# Patient Record
Sex: Female | Born: 1965 | Race: Black or African American | Hispanic: No | Smoking: Never smoker
Health system: Southern US, Community
[De-identification: ages and names within clinical notes are randomized; demographics above are authoritative.]

## PROBLEM LIST (undated history)

## (undated) DIAGNOSIS — I10 Essential (primary) hypertension: Secondary | ICD-10-CM

## (undated) DIAGNOSIS — E119 Type 2 diabetes mellitus without complications: Secondary | ICD-10-CM

## (undated) DIAGNOSIS — E785 Hyperlipidemia, unspecified: Secondary | ICD-10-CM

## (undated) HISTORY — DX: Essential (primary) hypertension: I10

## (undated) HISTORY — DX: Hyperlipidemia, unspecified: E78.5

## (undated) HISTORY — DX: Type 2 diabetes mellitus without complications: E11.9

---

## 2020-06-06 NOTE — Progress Notes (Signed)
Cardiology Office Note   Date:  06/07/2020   ID:  Abigail Ross, DOB Feb 24, 1965, MRN [id number]  PCP:  Lewis Moccasin, MD  Cardiologist:   No primary care provider on file. Referring:  Lewis Moccasin, MD  Chief Complaint  Patient presents with  . New Patient (Initial Visit)  . Chest Pain    Pressure.      History of Present Illness: Abigail Ross is a 55 y.o. female who presents for chest pain.  He was referred by Lewis Moccasin, MD.  The patient has no past cardiac history but she has multiple cardiovascular risk factors.  She has been having symptoms of palpitations.  This is when she is sometimes startles awake at night.  Her heart will race for a few minutes and then go away.  She really does not feel this during the day.  She does have some tingling and chest pressure.  This happens sporadically.  It is brief.  She cannot bring it on with activity.  She does have some leg pain.  This does not occur with activity either but it is in her left calf and is a cramping discomfort occasionally.  She cannot pinpoint why this happens.  She has multiple cardiovascular risk factors but has never had any cardiovascular testing.  She does not have associated symptoms such as shortness of breath, PND or orthopnea.  She has had no palpitations, presyncope or syncope.  He has had no weight gain or edema.     Past Medical History:  Diagnosis Date  . DM (diabetes mellitus) (HCC)   . Dyslipidemia   . HTN (hypertension)     History reviewed. No pertinent surgical history.   Current Outpatient Medications  Medication Sig Dispense Refill  . azelastine (ASTELIN) 0.1 % nasal spray Place 2 sprays into both nostrils daily as needed. Use in each nostril as directed    . bisoprolol (ZEBETA) 10 MG tablet Take 10 mg by mouth daily.    Marland Kitchen lisinopril-hydrochlorothiazide (ZESTORETIC) 20-25 MG tablet Take 1 tablet by mouth daily.    . metFORMIN (GLUCOPHAGE) 500 MG tablet Take 500 mg by mouth 2  (two) times daily with a meal.    . montelukast (SINGULAIR) 10 MG tablet Take 10 mg by mouth daily as needed.    . rosuvastatin (CRESTOR) 20 MG tablet Take 20 mg by mouth. Pt takes 1 tablet and a half daily at bedtime    . Semaglutide (RYBELSUS) 7 MG TABS Take 1 tablet by mouth daily.     No current facility-administered medications for this visit.    Allergies:   Patient has no known allergies.    Social History:  The patient  reports that she has never smoked. She has never used smokeless tobacco.   Family History:  The patient's family history includes Diabetes in her father; Hyperlipidemia in her father and mother; Hypertension in her father and mother.    ROS:  Please see the history of present illness.   Otherwise, review of systems are positive for none.   All other systems are reviewed and negative.    PHYSICAL EXAM: VS:  BP (!) 142/90 (BP Location: Left Arm, Patient Position: Sitting, Cuff Size: Normal)   Pulse 82   Ht 5\' 6"  (1.676 m)   Wt 202 lb (91.6 kg)   BMI 32.60 kg/m  , BMI Body mass index is 32.6 kg/m. GENERAL:  Well appearing HEENT:  Pupils equal round and reactive, fundi not visualized,  oral mucosa unremarkable NECK:  No jugular venous distention, waveform within normal limits, carotid upstroke brisk and symmetric, no bruits, no thyromegaly LYMPHATICS:  No cervical, inguinal adenopathy LUNGS:  Clear to auscultation bilaterally BACK:  No CVA tenderness CHEST:  Unremarkable HEART:  PMI not displaced or sustained,S1 and S2 within normal limits, no S3, no S4, no clicks, no rubs, no murmurs ABD:  Flat, positive bowel sounds normal in frequency in pitch, no bruits, no rebound, no guarding, no midline pulsatile mass, no hepatomegaly, no splenomegaly EXT:  2 plus pulses throughout, no edema, no cyanosis no clubbing SKIN:  No rashes no nodules NEURO:  Cranial nerves II through XII grossly intact, motor grossly intact throughout PSYCH:  Cognitively intact, oriented to  person place and time    EKG:  EKG is ordered today. The ekg ordered today demonstrates sinus rhythm, rate 82, axis within normal limits, intervals within normal limits, T wave inversions in the anterior leads.  No old EKGs for comparison.   Recent Labs: No results found for requested labs within last 8760 hours.    Lipid Panel No results found for: CHOL, TRIG, HDL, CHOLHDL, VLDL, LDLCALC, LDLDIRECT    Wt Readings from Last 3 Encounters:  06/07/20 202 lb (91.6 kg)      Other studies Reviewed: Additional studies/ records that were reviewed today include: Labs primary care office notes. Review of the above records demonstrates:  Please see elsewhere in the note.     ASSESSMENT AND PLAN:  Chest pain: Her chest pain is predominantly nonanginal sounding greater than anginal but she has significant cardiovascular risk factors and an abnormal EKG.  I am going to start with a coronary calcium score and a POET (Plain Old Exercise Treadmill).  Further evaluation and goals of therapy will be based on this.  Of note I did instruct her not to take her beta-blocker the day of the procedure.  If there are any abnormalities on this with her EKG and risk factors I would have a low threshold for further testing such as CT.  Dyslipidemia: Her LDL was 119.  Again goals of therapy will be based on any evidence of coronary disease such as calcium but we did talk about a plant-based diet and exercise and for now I will continue the meds as listed.  Hypertension: Her blood pressure is very mildly elevated.  We can keep a watch on his during the stress test and have home and for now I will continue the meds as listed.   Diabetes mellitus: A1c is 7.0.  No change in therapy.   Current medicines are reviewed at length with the patient today.  The patient does not have concerns regarding medicines.  The following changes have been made:  no change  Labs/ tests ordered today include:   Orders Placed  This Encounter  Procedures  . CT CARDIAC SCORING (SELF PAY ONLY)  . EXERCISE TOLERANCE TEST (ETT)  . EKG 12-Lead     Disposition:   FU with me based on the results of the above.      Signed, Rollene Rotunda, MD  06/07/2020 10:15 AM    [facility]

## 2020-06-07 ENCOUNTER — Encounter: Payer: Self-pay | Admitting: Cardiology

## 2020-06-07 ENCOUNTER — Ambulatory Visit: Payer: BC Managed Care – PPO | Admitting: Cardiology

## 2020-06-07 ENCOUNTER — Other Ambulatory Visit: Payer: Self-pay

## 2020-06-07 VITALS — BP 142/90 | HR 82 | Ht 66.0 in | Wt 202.0 lb

## 2020-06-07 DIAGNOSIS — E785 Hyperlipidemia, unspecified: Secondary | ICD-10-CM

## 2020-06-07 DIAGNOSIS — I10 Essential (primary) hypertension: Secondary | ICD-10-CM

## 2020-06-07 DIAGNOSIS — R072 Precordial pain: Secondary | ICD-10-CM

## 2020-06-07 NOTE — Patient Instructions (Signed)
Medication Instructions:  Continue current medications  *If you need a refill on your cardiac medications before your next appointment, please call your pharmacy*   Lab Work: None Ordered   Testing/Procedures: Your physician has requested that you have a Coronary calcium Score Test. This test is done at our [facility].  Your physician has requested that you have an exercise tolerance test. For further information please visit [email or web address] Please also follow instruction sheet, as given.   Follow-Up: At [facility], you and your health needs are our priority.  As part of our continuing mission to provide you with exceptional heart care, we have created designated Provider Care Teams.  These Care Teams include your primary Cardiologist (physician) and Advanced Practice Providers (APPs -  Physician Assistants and Nurse Practitioners) who all work together to provide you with the care you need, when you need it.  We recommend signing up for the patient portal called "MyChart".  Sign up information is provided on this After Visit Summary.  MyChart is used to connect with patients for Virtual Visits (Telemedicine).  Patients are able to view lab/test results, encounter notes, upcoming appointments, etc.  Non-urgent messages can be sent to your provider as well.   To learn more about what you can do with MyChart, go to [email or web address].    Your next appointment:    As Needed

## 2020-06-07 NOTE — Addendum Note (Signed)
Addended by: Barrie Dunker on: 06/07/2020 11:42 AM   Modules accepted: Orders

## 2020-06-10 ENCOUNTER — Ambulatory Visit: Payer: BC Managed Care – PPO | Admitting: Cardiology

## 2020-06-21 ENCOUNTER — Telehealth (HOSPITAL_COMMUNITY): Payer: Self-pay | Admitting: *Deleted

## 2020-06-21 NOTE — Telephone Encounter (Signed)
Close encounter 

## 2020-06-25 ENCOUNTER — Encounter (HOSPITAL_COMMUNITY): Payer: BC Managed Care – PPO

## 2020-06-26 ENCOUNTER — Other Ambulatory Visit: Payer: Self-pay | Admitting: *Deleted

## 2020-06-26 ENCOUNTER — Other Ambulatory Visit: Payer: Self-pay

## 2020-06-26 ENCOUNTER — Ambulatory Visit (HOSPITAL_COMMUNITY)
Admission: RE | Admit: 2020-06-26 | Discharge: 2020-06-26 | Disposition: A | Discharge status: Home / Self Care | Payer: BC Managed Care – PPO | Source: Ambulatory Visit | Attending: Cardiology | Admitting: Cardiology

## 2020-06-26 DIAGNOSIS — R072 Precordial pain: Secondary | ICD-10-CM | POA: Diagnosis not present

## 2020-06-26 LAB — EXERCISE TOLERANCE TEST
Estimated workload: 9.4 METS
Exercise duration (min): 7 min
Exercise duration (sec): 36 s
MPHR: 167 {beats}/min
Peak HR: 144 {beats}/min
Percent HR: 86 %
Rest HR: 84 {beats}/min

## 2020-07-19 ENCOUNTER — Ambulatory Visit (INDEPENDENT_AMBULATORY_CARE_PROVIDER_SITE_OTHER)
Admission: RE | Admit: 2020-07-19 | Discharge: 2020-07-19 | Disposition: A | Discharge status: Home / Self Care | Payer: Self-pay | Source: Ambulatory Visit | Attending: Cardiology | Admitting: Cardiology

## 2020-07-19 ENCOUNTER — Other Ambulatory Visit: Payer: Self-pay

## 2020-07-19 DIAGNOSIS — R072 Precordial pain: Secondary | ICD-10-CM

## 2021-09-09 IMAGING — CT CT CARDIAC CORONARY ARTERY CALCIUM SCORE
3 series · 14 of 20 positions shown, 15 images · non-contrast
Comparison: None.
COMPARISON: None.

Addendum:
EXAM:
OVER-READ INTERPRETATION  CT CHEST

The following report is an over-read performed by radiologist Dr.
Nazareth Jumper [REDACTED] on 07/19/2020. This over-read
does not include interpretation of cardiac or coronary anatomy or
pathology. The calcium score interpretation by the cardiologist is
attached.
CLINICAL DATA: Cardiovascular disease risk stratification
CT Coronary Calcium Score
TECHNIQUE: A gated, non-contrast computed tomography scan of the heart was
performed using 3mm slice thickness. Axial images were analyzed on a
dedicated workstation. Calcium scoring of the coronary arteries was
performed using the Agatston method.

[Series 2: casc 3.0 bv41 2 bestdiast 73 % · axial · 0.39mm/px · z∈[-126,-50]mm · 4 of 43 slices shown, 5 images]
[im 9/43  vessel]
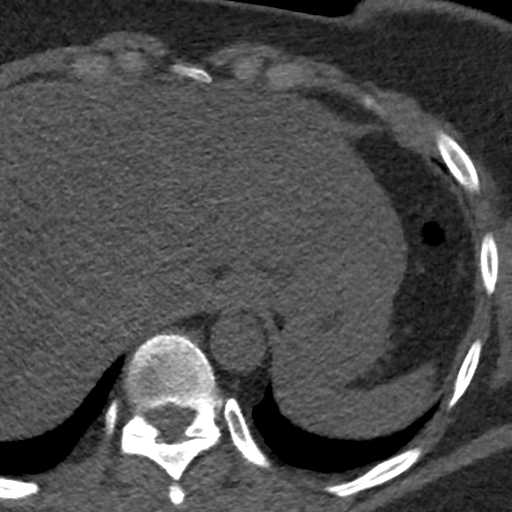
[im 9/43  lung]
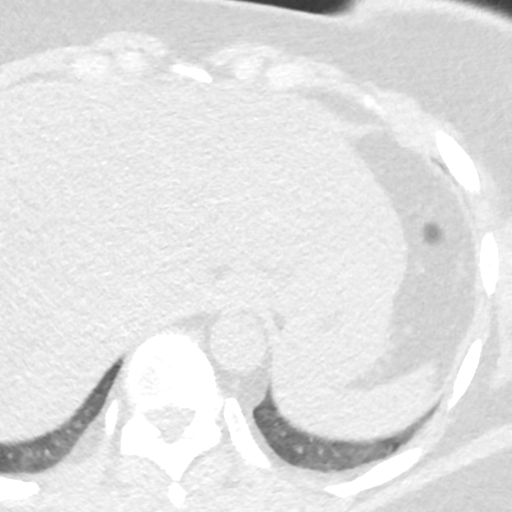
[im 17/43  vessel]
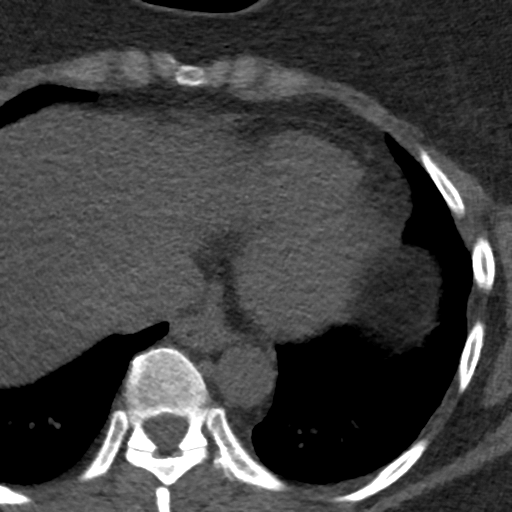
[im 26/43  vessel]
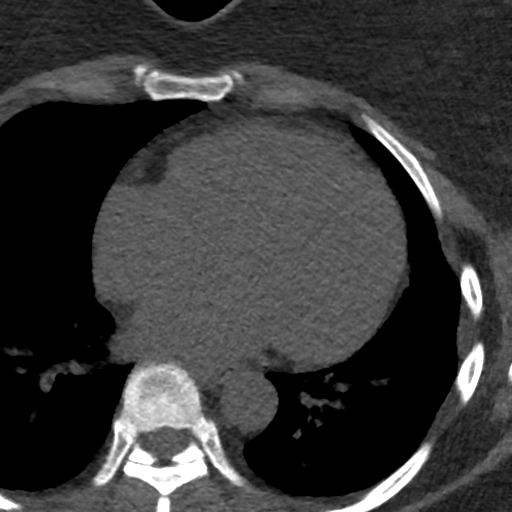
[im 34/43  vessel]
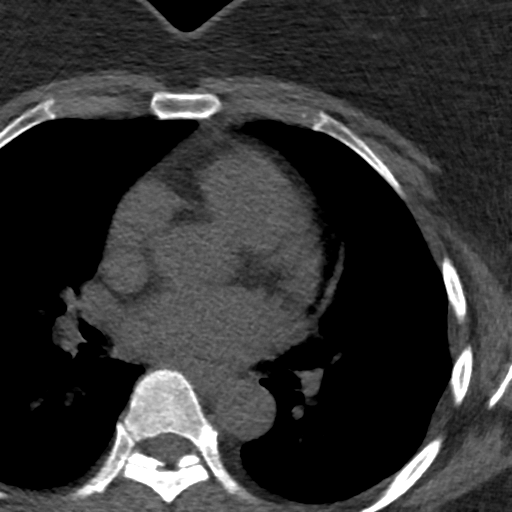

[Series 3: lung 73 % · axial · 0.63mm/px · z∈[-128,-44]mm · 5 of 43 slices shown]
[im 8/43  lung]
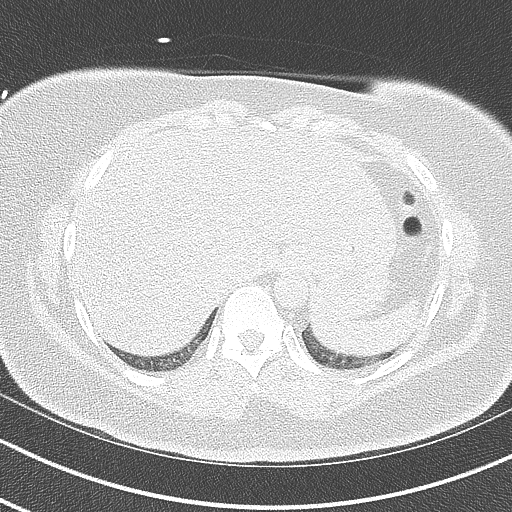
[im 15/43  lung]
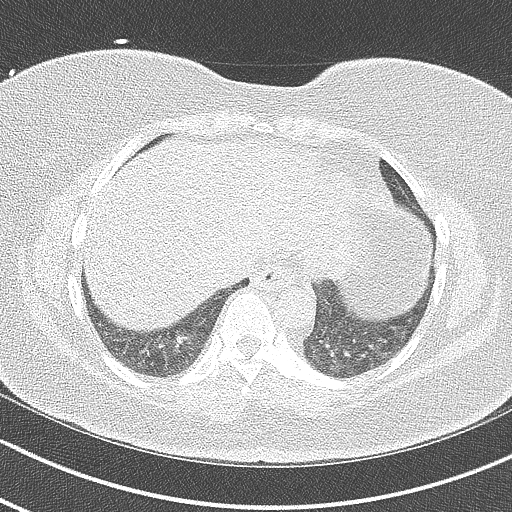
[im 22/43  lung]
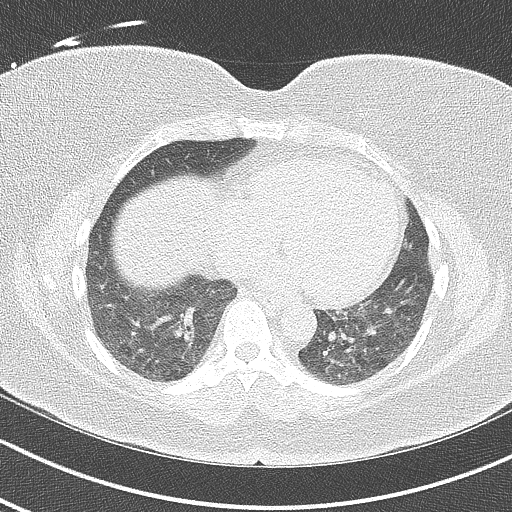
[im 29/43  lung]
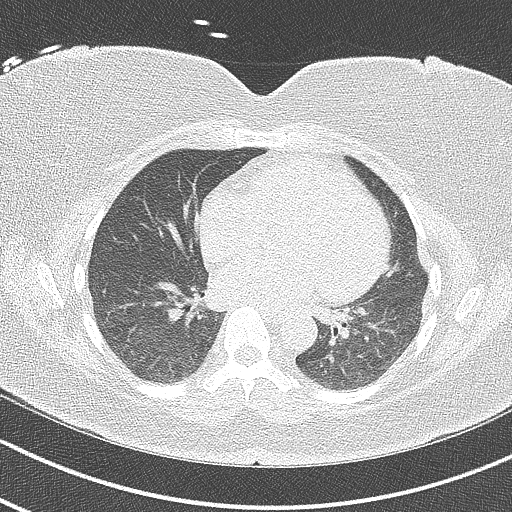
[im 36/43  lung]
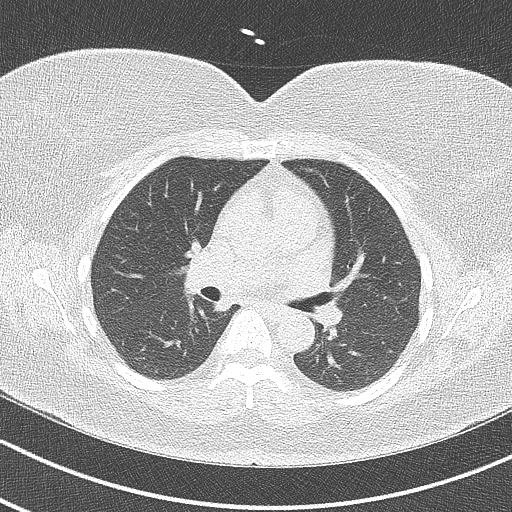

[Series 4: lung st 73 % · axial · 0.63mm/px · z∈[-128,-44]mm · 5 of 43 slices shown]
[im 8/43  lung]
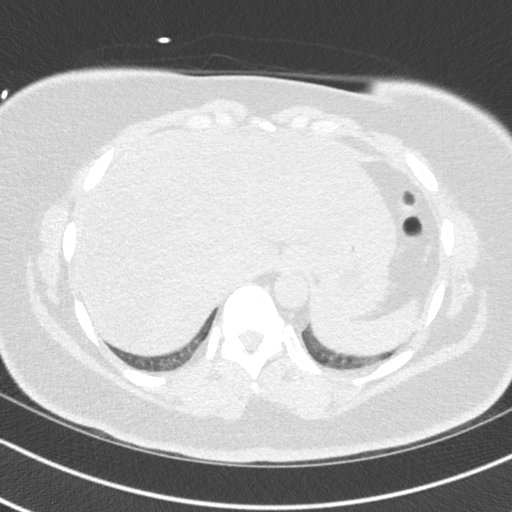
[im 15/43  lung]
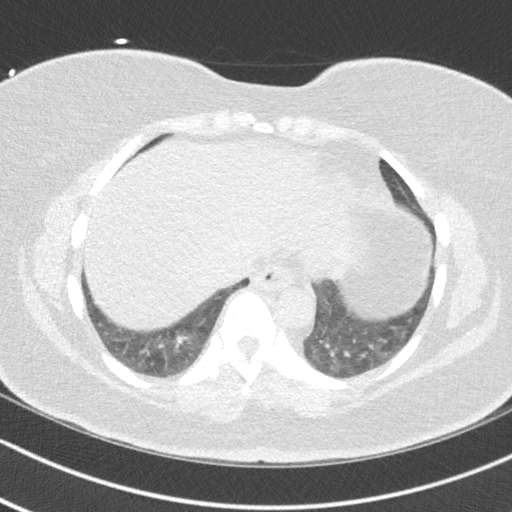
[im 22/43  lung]
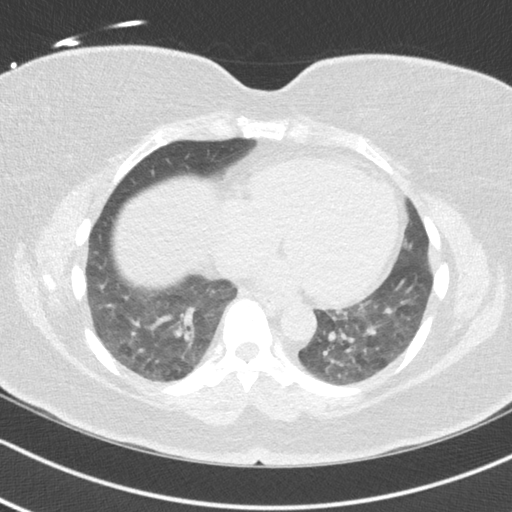
[im 29/43  lung]
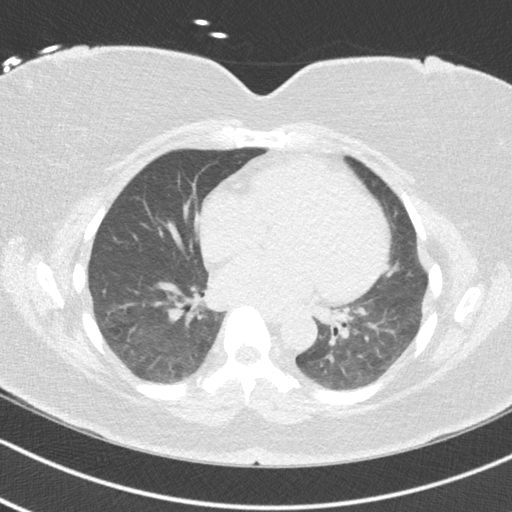
[im 36/43  lung]
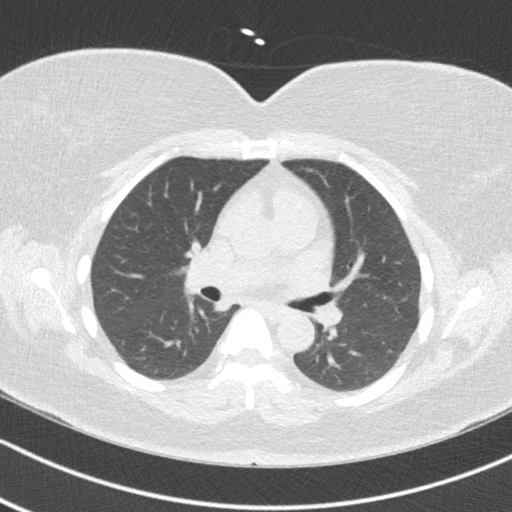

[14 of 20 positions shown; findings below may reference images not displayed]

FINDINGS: Vascular: Normal aortic caliber.

Mediastinum/Nodes: No imaged thoracic adenopathy.

Lungs/Pleura: No pleural fluid.  Clear imaged lungs.

Upper Abdomen: Moderate hepatic steatosis, mildly heterogeneous.
Normal imaged portions of the spleen, stomach.

Musculoskeletal: No acute osseous abnormality.
IMPRESSION: 1. No acute process in the imaged extracardiac chest.
2. Hepatic steatosis.
FINDINGS: Coronary Calcium Score:

Left main: 0

Left anterior descending artery: 0

Left circumflex artery: 1

Right coronary artery: 0

Total: 1

Percentile: 81st

Pericardium: Normal.

Ascending Aorta: Normal caliber. Ascending aorta measures
approximately 30mm at the mid ascending aorta measured in an axial
plane.

Non-cardiac: See separate report from [REDACTED].
IMPRESSION: Coronary calcium score of 1. This was 81st percentile for age-,
race-, and sex-matched controls.



If CAC=0, it is reasonable to withhold statin therapy and reassess
in 5 to 10 years, as long as higher risk conditions are absent
(diabetes mellitus, family history of premature CHD in first degree
relatives (males <55 years; females <65 years), cigarette smoking,
or LDL >=190 mg/dL).

If CAC is 1 to 99, it is reasonable to initiate statin therapy for
patients >=55 years of age.

If CAC is >=100 or >=75th percentile, it is reasonable to initiate
statin therapy at any age.

Cardiology referral should be considered for patients with CAC
scores >=400 or >=75th percentile.

*3743 AHA/ACC/AACVPR/AAPA/ABC/BOGO/BAXEVANAKIS/FRANTZ/Kairaitiene/ORTUNO/XAN/ZORIA
Guideline on the Management of Blood Cholesterol: A Report of the
American College of Cardiology/American Heart Association Task Force
on Clinical Practice Guidelines. J Am Coll Cardiol.
1680;73(24):8387-8788.

*** End of Addendum ***
EXAM:
OVER-READ INTERPRETATION  CT CHEST

The following report is an over-read performed by radiologist Dr.
Nazareth Jumper [REDACTED] on 07/19/2020. This over-read
does not include interpretation of cardiac or coronary anatomy or
pathology. The calcium score interpretation by the cardiologist is
attached.
FINDINGS: Vascular: Normal aortic caliber.

Mediastinum/Nodes: No imaged thoracic adenopathy.

Lungs/Pleura: No pleural fluid.  Clear imaged lungs.

Upper Abdomen: Moderate hepatic steatosis, mildly heterogeneous.
Normal imaged portions of the spleen, stomach.

Musculoskeletal: No acute osseous abnormality.
IMPRESSION: 1. No acute process in the imaged extracardiac chest.
2. Hepatic steatosis.
